# Patient Record
Sex: Female | Born: 2000 | Race: White | Hispanic: No | Marital: Single
Health system: Midwestern US, Academic
[De-identification: ages and names within clinical notes are randomized; demographics above are authoritative.]

---
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
## 2022-01-12 ENCOUNTER — Ambulatory Visit: Admit: 2022-01-12 | Discharge: 2022-01-12 | Payer: BC Managed Care – PPO

## 2022-01-12 ENCOUNTER — Encounter: Admit: 2022-01-12 | Discharge: 2022-01-12 | Payer: BC Managed Care – PPO

## 2022-01-12 DIAGNOSIS — M79672 Pain in left foot: Secondary | ICD-10-CM

## 2022-01-12 DIAGNOSIS — M25872 Other specified joint disorders, left ankle and foot: Secondary | ICD-10-CM

## 2022-01-12 NOTE — Patient Instructions
Please do not hesitate to contact my office with any questions.    Dr. Bryan Vopat & Glendal Cassaday Caldwell PA-C - Orthopedic Surgeon, Sports Medicine  [facility] - Phone [phone] - Fax [phone]   [address], [address] - [place], [place] [address]    To schedule an appointment, please call our scheduling line at [phone]    Kara Schuessler BSN, RN - Clinical Nurse Coordinator  Casey Conover BSN, RN - Clinical Nurse Coordinator  Bastian Andreoli ATC, LAT - Clinical Athletic Trainer    Thank you for supporting our practice! Your feedback helps us deliver the highest quality of care.  Review us at: [email or web address]

## 2022-01-14 ENCOUNTER — Ambulatory Visit: Admit: 2022-01-14 | Discharge: 2022-01-14 | Payer: BC Managed Care – PPO

## 2022-01-14 ENCOUNTER — Encounter: Admit: 2022-01-14 | Discharge: 2022-01-14 | Payer: BC Managed Care – PPO

## 2022-01-14 DIAGNOSIS — M25872 Other specified joint disorders, left ankle and foot: Secondary | ICD-10-CM

## 2022-01-14 MED ORDER — TRIAMCINOLONE ACETONIDE 40 MG/ML IJ SUSP
40 mg | Freq: Once | INTRAMUSCULAR | 0 refills | Status: CP
Start: 2022-01-14 — End: ?
  Administered 2022-01-14: 22:00:00 40 mg via INTRAMUSCULAR

## 2022-01-14 MED ORDER — ROPIVACAINE (PF) 2 MG/ML (0.2 %) IJ SOLN
10 mL | Freq: Once | INTRAMUSCULAR | 0 refills | Status: CP
Start: 2022-01-14 — End: ?
  Administered 2022-01-14: 22:00:00 3 mL via INTRAMUSCULAR

## 2022-02-28 IMAGING — CR [ID]
3 series · 3 of 3 positions shown · non-contrast
Comparison: No relevant prior studies available.

DIAGNOSTIC STUDIES

EXAM:  XR LEFT HAND COMPLETE, 3 OR MORE VIEWS  (26444)
INDICATION: pain MCP 5th pain at base of 5th digit and metacarpal after hand was jammed against a
basketball

[hand pa]
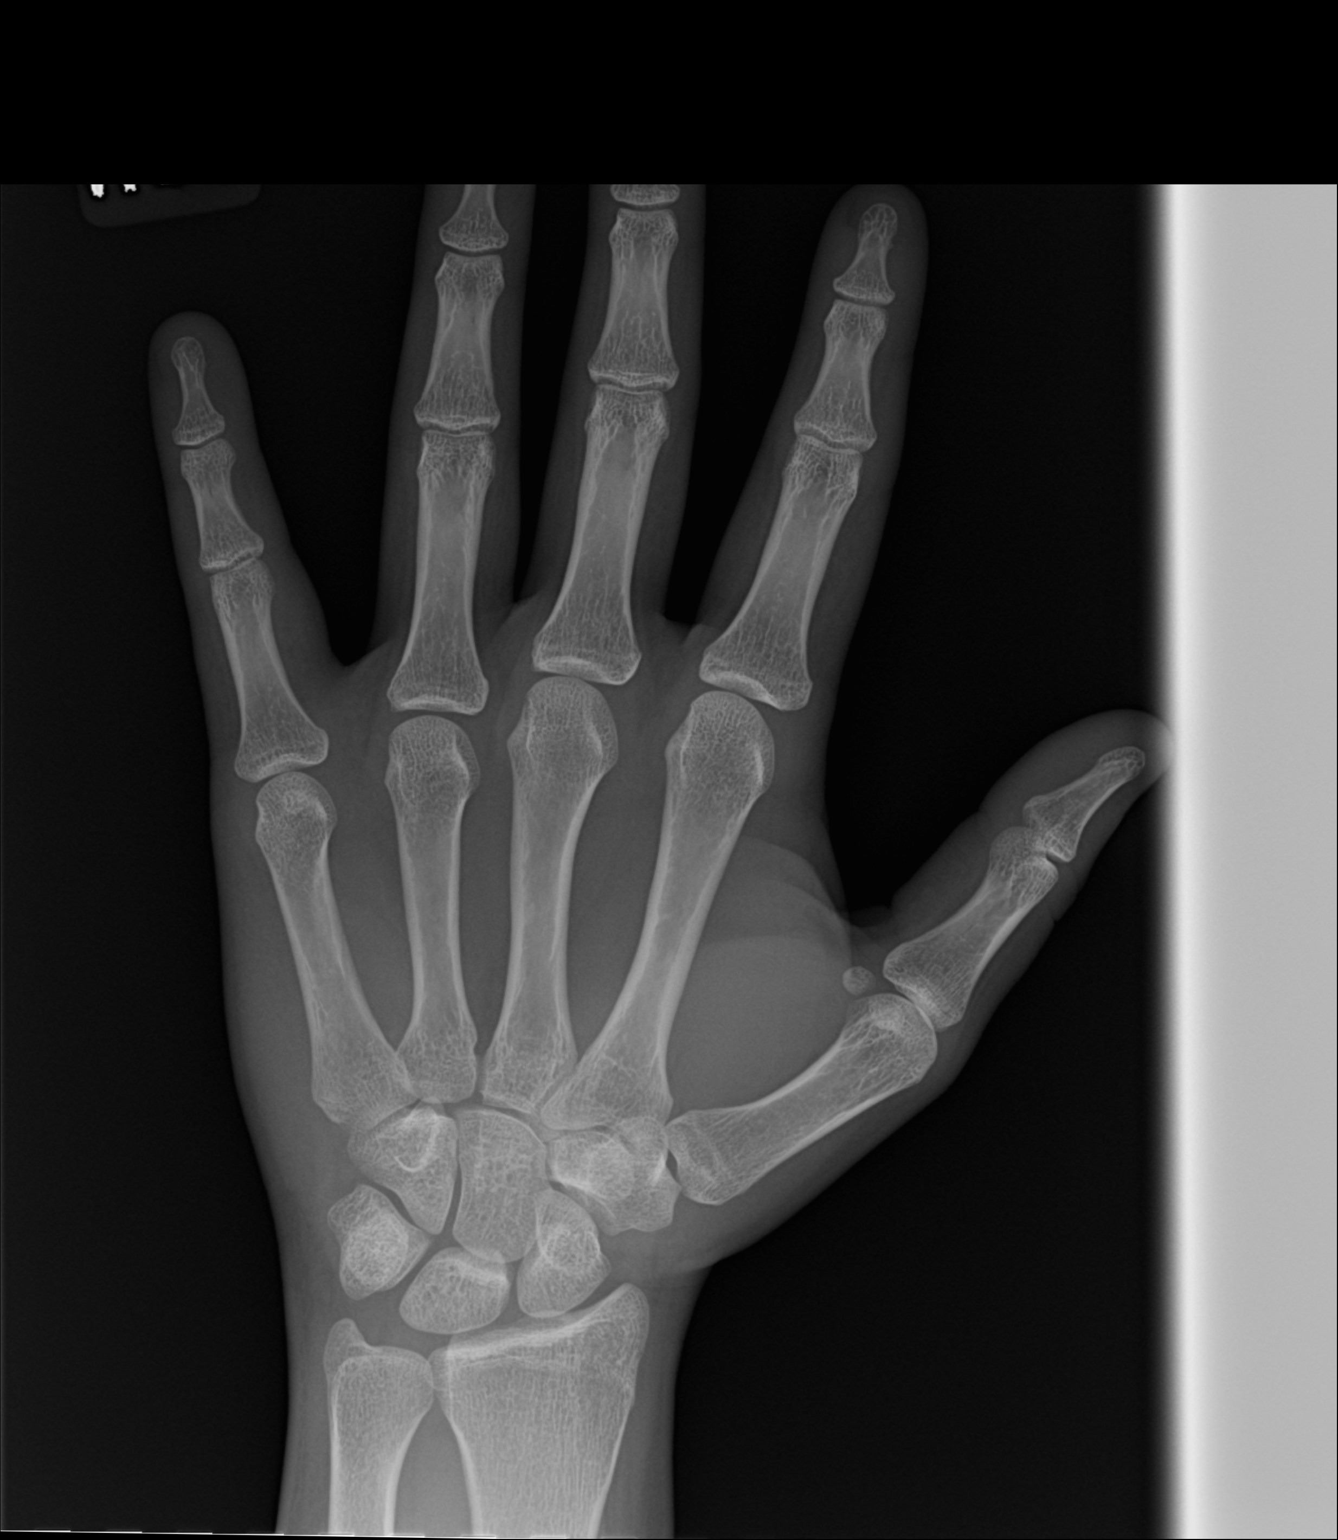

[hand obl]
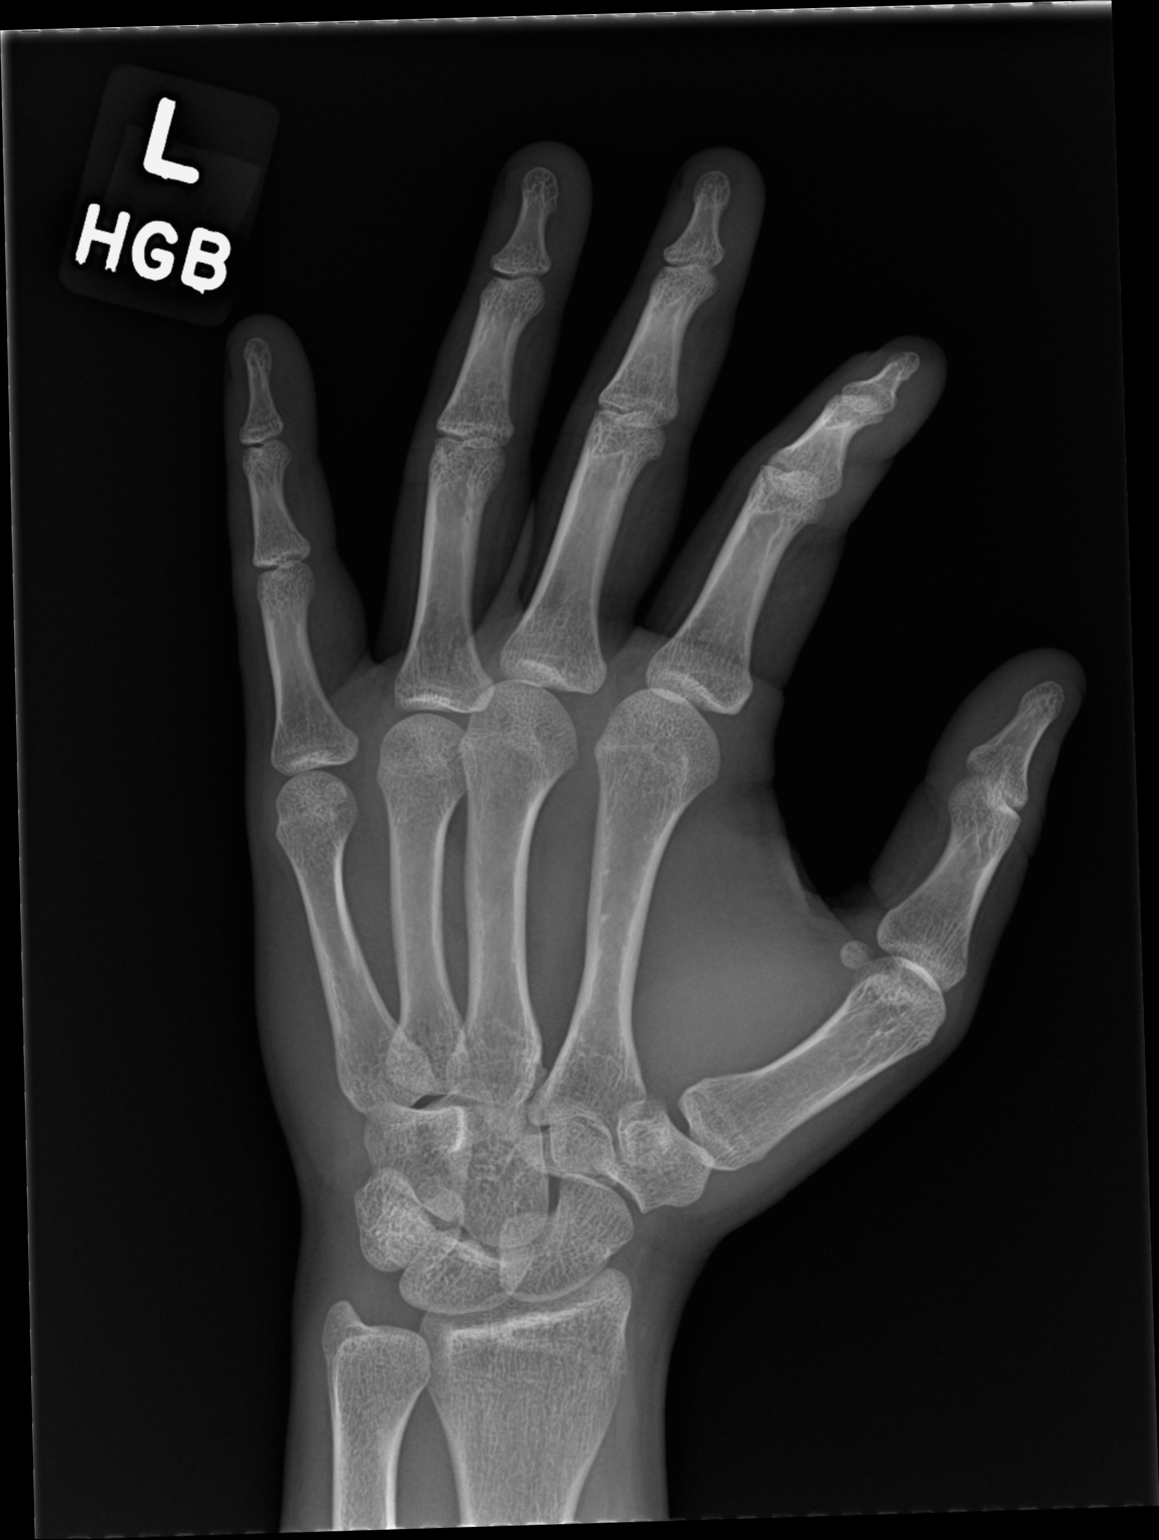

[hand lat]
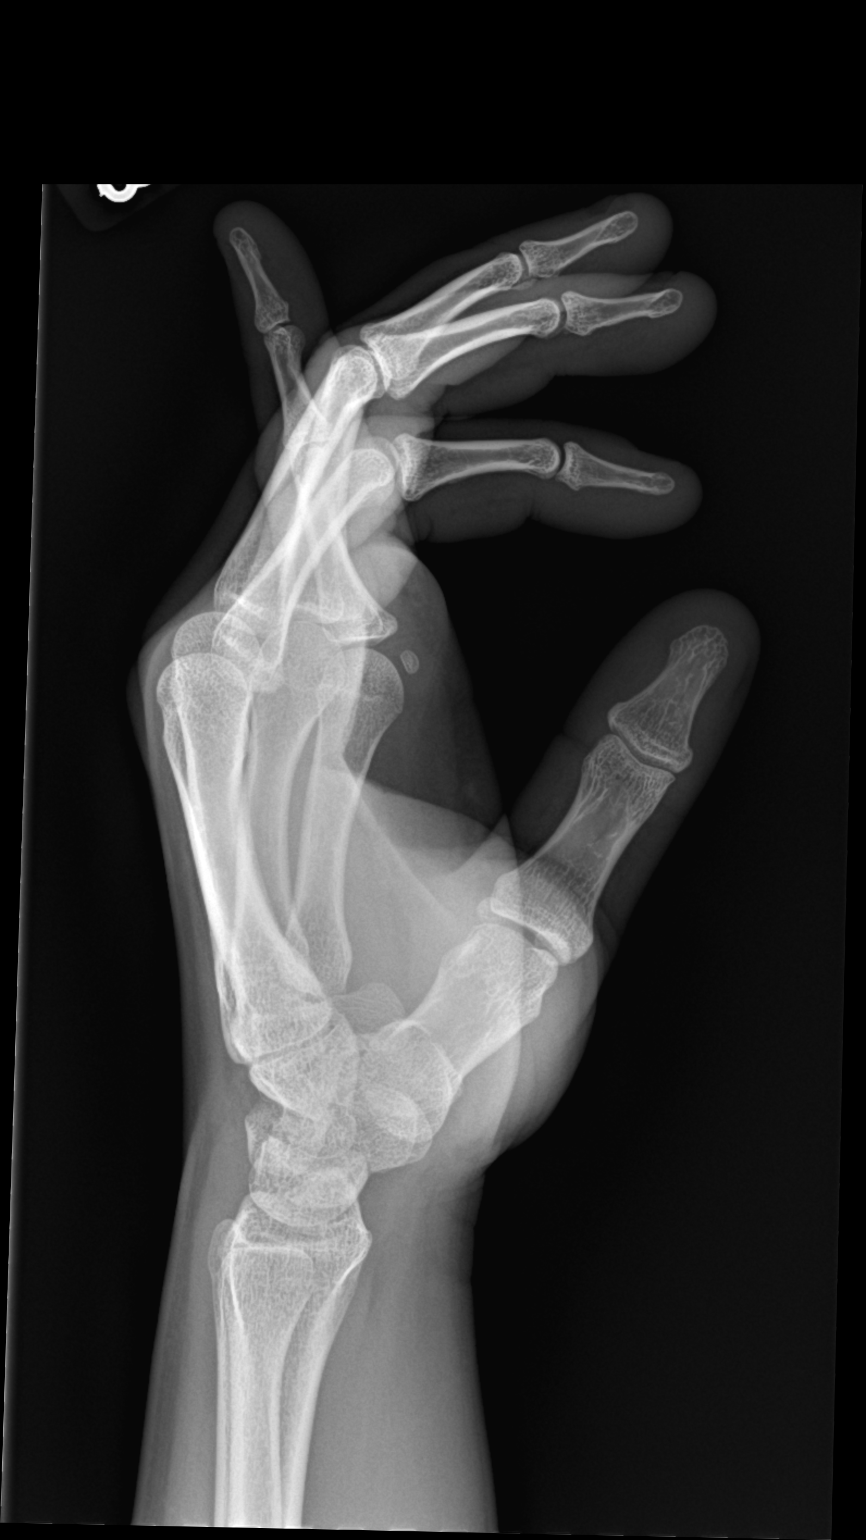

[3 of 3 positions shown; findings below may reference images not displayed]

FINDINGS: BONES/JOINTS:  NO definite fracture or dislocation.

SOFT TISSUES:  NO radiopaque foreign body.
IMPRESSION: - NO acute fractures.

Tech Notes:

pain at base of 5th digit and metacarpal after hand was jammed against a basketball
